# Patient Record
Sex: Female | Born: 1965 | Race: White | Hispanic: No | Marital: Married | Smoking: Never smoker
Health system: Southern US, Community
[De-identification: ages and names within clinical notes are randomized; demographics above are authoritative.]

## PROBLEM LIST (undated history)

## (undated) HISTORY — PX: NASAL SINUS SURGERY: SHX719

## (undated) HISTORY — PX: BREAST ENHANCEMENT SURGERY: SHX7

---

## 2008-09-16 ENCOUNTER — Ambulatory Visit: Payer: Self-pay | Admitting: Internal Medicine

## 2008-09-29 HISTORY — PX: AUGMENTATION MAMMAPLASTY: SUR837

## 2009-12-15 ENCOUNTER — Ambulatory Visit: Payer: Self-pay | Admitting: Internal Medicine

## 2011-10-11 ENCOUNTER — Ambulatory Visit: Payer: Self-pay

## 2011-10-11 LAB — URINALYSIS, COMPLETE
Bilirubin,UR: NEGATIVE
Glucose,UR: NEGATIVE mg/dL (ref 0–75)
Ketone: NEGATIVE
Nitrite: NEGATIVE
Ph: 6 (ref 4.5–8.0)

## 2013-06-28 ENCOUNTER — Ambulatory Visit: Payer: Self-pay | Admitting: Family Medicine

## 2013-10-30 ENCOUNTER — Ambulatory Visit: Payer: Self-pay | Admitting: Physician Assistant

## 2013-10-30 LAB — RAPID STREP-A WITH REFLX: MICRO TEXT REPORT: NEGATIVE

## 2013-11-01 LAB — BETA STREP CULTURE(ARMC)

## 2013-11-22 ENCOUNTER — Ambulatory Visit: Payer: Self-pay | Admitting: Internal Medicine

## 2014-03-05 ENCOUNTER — Ambulatory Visit: Payer: Self-pay | Admitting: Emergency Medicine

## 2014-10-11 ENCOUNTER — Ambulatory Visit: Payer: Self-pay | Admitting: Family Medicine

## 2014-10-31 ENCOUNTER — Ambulatory Visit: Payer: Self-pay | Admitting: Family Medicine

## 2015-10-16 DIAGNOSIS — D500 Iron deficiency anemia secondary to blood loss (chronic): Secondary | ICD-10-CM | POA: Insufficient documentation

## 2016-07-08 ENCOUNTER — Emergency Department
Admission: EM | Admit: 2016-07-08 | Discharge: 2016-07-09 | Disposition: A | Discharge status: Home / Self Care | Payer: Managed Care, Other (non HMO) | Attending: Emergency Medicine | Admitting: Emergency Medicine

## 2016-07-08 ENCOUNTER — Ambulatory Visit
Admission: EM | Admit: 2016-07-08 | Discharge: 2016-07-08 | Disposition: A | Discharge status: Hospice | Payer: Managed Care, Other (non HMO) | Attending: Family Medicine | Admitting: Family Medicine

## 2016-07-08 ENCOUNTER — Encounter: Payer: Self-pay | Admitting: Emergency Medicine

## 2016-07-08 DIAGNOSIS — R5381 Other malaise: Secondary | ICD-10-CM

## 2016-07-08 DIAGNOSIS — M791 Myalgia, unspecified site: Secondary | ICD-10-CM

## 2016-07-08 DIAGNOSIS — B349 Viral infection, unspecified: Secondary | ICD-10-CM | POA: Diagnosis not present

## 2016-07-08 DIAGNOSIS — R509 Fever, unspecified: Secondary | ICD-10-CM

## 2016-07-08 LAB — COMPREHENSIVE METABOLIC PANEL
ALK PHOS: 42 U/L (ref 38–126)
ALT: 15 U/L (ref 14–54)
ANION GAP: 7 (ref 5–15)
AST: 23 U/L (ref 15–41)
Albumin: 4.5 g/dL (ref 3.5–5.0)
BILIRUBIN TOTAL: 0.6 mg/dL (ref 0.3–1.2)
BUN: 13 mg/dL (ref 6–20)
CALCIUM: 9.5 mg/dL (ref 8.9–10.3)
CO2: 28 mmol/L (ref 22–32)
Chloride: 101 mmol/L (ref 101–111)
Creatinine, Ser: 0.73 mg/dL (ref 0.44–1.00)
GLUCOSE: 129 mg/dL — AB (ref 65–99)
POTASSIUM: 3.4 mmol/L — AB (ref 3.5–5.1)
Sodium: 136 mmol/L (ref 135–145)
TOTAL PROTEIN: 7 g/dL (ref 6.5–8.1)

## 2016-07-08 LAB — CBC WITH DIFFERENTIAL/PLATELET
BASOS ABS: 0 10*3/uL (ref 0–0.1)
BASOS PCT: 0 %
Eosinophils Absolute: 0 10*3/uL (ref 0–0.7)
Eosinophils Relative: 0 %
HEMATOCRIT: 36.8 % (ref 35.0–47.0)
Hemoglobin: 12.6 g/dL (ref 12.0–16.0)
LYMPHS PCT: 4 %
Lymphs Abs: 0.4 10*3/uL — ABNORMAL LOW (ref 1.0–3.6)
MCH: 30 pg (ref 26.0–34.0)
MCHC: 34.2 g/dL (ref 32.0–36.0)
MCV: 87.6 fL (ref 80.0–100.0)
MONO ABS: 0.2 10*3/uL (ref 0.2–0.9)
Monocytes Relative: 2 %
NEUTROS ABS: 10 10*3/uL — AB (ref 1.4–6.5)
NEUTROS PCT: 94 %
Platelets: 259 10*3/uL (ref 150–440)
RBC: 4.21 MIL/uL (ref 3.80–5.20)
RDW: 16.5 % — AB (ref 11.5–14.5)
WBC: 10.6 10*3/uL (ref 3.6–11.0)

## 2016-07-08 LAB — URINALYSIS COMPLETE WITH MICROSCOPIC (ARMC ONLY)
BACTERIA UA: NONE SEEN
Bilirubin Urine: NEGATIVE
GLUCOSE, UA: NEGATIVE mg/dL
Leukocytes, UA: NEGATIVE
NITRITE: NEGATIVE
PH: 7 (ref 5.0–8.0)
PROTEIN: NEGATIVE mg/dL
SPECIFIC GRAVITY, URINE: 1.02 (ref 1.005–1.030)
WBC UA: NONE SEEN WBC/hpf (ref 0–5)

## 2016-07-08 LAB — RAPID STREP SCREEN (MED CTR MEBANE ONLY): Streptococcus, Group A Screen (Direct): NEGATIVE

## 2016-07-08 LAB — RAPID INFLUENZA A&B ANTIGENS (ARMC ONLY)
INFLUENZA A (ARMC): NEGATIVE
INFLUENZA B (ARMC): NEGATIVE

## 2016-07-08 NOTE — ED Provider Notes (Signed)
[facility]    CSN: [id number] Arrival date & time: 07/08/16  2000     History   Chief Complaint No chief complaint on file.   HPI Melissa Farrell is a 50 y.o. female.   Patient's here because of fever. Her husband states that they just got through her 3 mile walk about 6:30 and 7 he found her chills and high fever. She denies seeing cold symptoms URI symptoms but she's has some right back pain and a high temp greater than 102. She denies any medical problems. Previous surgery of breast enhancement surgery nasal sinus surgery and termination of pregnancy when she was about 17. She states the last time she felt like this she had positive Streptococcus infection when she was pregnant. She feels miserable. She never smoked. She is allergic to Levaquin and she states that she thinks she is allergic to another antibiotic that she does not know the name of. No pertinent family medical history relative to today's visit.   The history is provided by the patient.  Fever  Temp source:  Oral Onset quality:  Sudden Duration:  3 hours Timing:  Constant Progression:  Unchanged Chronicity:  New Relieved by:  Nothing Worsened by:  Nothing Ineffective treatments:  None tried Associated symptoms: chills and myalgias   Associated symptoms: no chest pain, no confusion, no congestion, no cough, no diarrhea, no dysuria, no ear pain, no headaches, no nausea, no rash, no rhinorrhea, no somnolence, no sore throat and no vomiting     History reviewed. No pertinent past medical history.  There are no active problems to display for this patient.   Past Surgical History:  Procedure Laterality Date  . BREAST ENHANCEMENT SURGERY    . NASAL SINUS SURGERY      OB History    No data available       Home Medications    Prior to Admission medications   Medication Sig Start Date End Date Taking? Authorizing Provider  cycloSPORINE (RESTASIS) 0.05 % ophthalmic emulsion 1 drop 2  (two) times daily.   Yes Historical Provider, MD    Family History History reviewed. No pertinent family history.  Social History Social History  Substance Use Topics  . Smoking status: Never Smoker  . Smokeless tobacco: Never Used  . Alcohol use Yes     Comment: occasionally     Allergies   Levaquin [levofloxacin in d5w]   Review of Systems Review of Systems  Constitutional: Positive for chills and fever.  HENT: Negative for congestion, ear pain, rhinorrhea and sore throat.   Respiratory: Negative for cough.   Cardiovascular: Negative for chest pain.  Gastrointestinal: Negative for diarrhea, nausea and vomiting.  Genitourinary: Positive for vaginal bleeding. Negative for dysuria.  Musculoskeletal: Positive for back pain and myalgias.  Skin: Negative for rash.  Neurological: Negative for headaches.  Psychiatric/Behavioral: Negative for confusion.  All other systems reviewed and are negative.    Physical Exam Triage Vital Signs ED Triage Vitals  Enc Vitals Group     BP 07/08/16 2015 125/65     Pulse Rate 07/08/16 2015 100     Resp 07/08/16 2015 17     Temp 07/08/16 2015 (!) 102.6 F (39.2 C)     Temp Source 07/08/16 2015 Oral     SpO2 07/08/16 2015 98 %     Weight 07/08/16 2016 104 lb (47.2 kg)     Height 07/08/16 2016 5\' 2"  (1.575 m)     Head Circumference --  Peak Flow --      Pain Score 07/08/16 2019 8     Pain Loc --      Pain Edu? --      Excl. in GC? --    No data found.   Updated Vital Signs BP 125/65 (BP Location: Left Arm)   Pulse 100   Temp (!) 102.6 F (39.2 C) (Oral)   Resp 17   Ht 5\' 2"  (1.575 m)   Wt 104 lb (47.2 kg)   SpO2 98%   BMI 19.02 kg/m   Visual Acuity Right Eye Distance:   Left Eye Distance:   Bilateral Distance:    Right Eye Near:   Left Eye Near:    Bilateral Near:     Physical Exam  Constitutional: She is oriented to person, place, and time. She appears well-developed and well-nourished.  HENT:  Head:  Normocephalic and atraumatic.  Right Ear: External ear normal.  Eyes: Pupils are equal, round, and reactive to light.  Neck: Normal range of motion. Neck supple. No tracheal deviation present. No thyromegaly present.  Cardiovascular: Normal rate, regular rhythm and normal heart sounds.   Pulmonary/Chest: Effort normal and breath sounds normal.  Abdominal: Soft.  Musculoskeletal: Normal range of motion.  Neurological: She is alert and oriented to person, place, and time.  Skin: Skin is warm.  Psychiatric: She has a normal mood and affect.  Vitals reviewed.    UC Treatments / Results  Labs (all labs ordered are listed, but only abnormal results are displayed) Labs Reviewed  URINALYSIS COMPLETEWITH MICROSCOPIC ([facility] ONLY) - Abnormal; Notable for the following:       Result Value   Ketones, ur TRACE (*)    Hgb urine dipstick TRACE (*)    Squamous Epithelial / LPF 0-5 (*)    All other components within normal limits  RAPID INFLUENZA A&B ANTIGENS ([facility] ONLY)  RAPID STREP SCREEN (NOT AT [facility])  CULTURE, GROUP A STREP [facility])    EKG  EKG Interpretation None       Radiology No results found.  Procedures Procedures (including critical care time)  Medications Ordered in UC Medications - No data to display   Initial Impression / Assessment and Plan / UC Course  I have reviewed the triage vital signs and the nursing notes.  Pertinent labs & imaging results that were available during my care of the patient were reviewed by me and considered in my medical decision making (see chart for details). Results for orders placed or performed during the hospital encounter of 07/08/16  Rapid Influenza A&B Antigens ([facility] only)  Result Value Ref Range   Influenza A ([facility]) NEGATIVE NEGATIVE   Influenza B ([facility]) NEGATIVE NEGATIVE  Rapid strep screen  Result Value Ref Range   Streptococcus, Group A Screen (Direct) NEGATIVE NEGATIVE  Urinalysis complete, with microscopic  Result Value Ref  Range   Color, Urine YELLOW YELLOW   APPearance CLEAR CLEAR   Glucose, UA NEGATIVE NEGATIVE mg/dL   Bilirubin Urine NEGATIVE NEGATIVE   Ketones, ur TRACE (A) NEGATIVE mg/dL   Specific Gravity, Urine 1.020 1.005 - 1.030   Hgb urine dipstick TRACE (A) NEGATIVE   pH 7.0 5.0 - 8.0   Protein, ur NEGATIVE NEGATIVE mg/dL   Nitrite NEGATIVE NEGATIVE   Leukocytes, UA NEGATIVE NEGATIVE   RBC / HPF 0-5 0 - 5 RBC/hpf   WBC, UA NONE SEEN 0 - 5 WBC/hpf   Bacteria, UA NONE SEEN NONE SEEN   Squamous Epithelial /  LPF 0-5 (A) NONE SEEN   Clinical Course   Because of the high fever and patient having a toxic appearance and the lateness of the evening i.e. they literally with the door for Ms. Ford closed and a pulmonary test all negative will send her to [facility] ED for further evaluation and possible treatment. Discussed charge nurse Racquel that she is aware of the eminent arrival. Final Clinical Impressions(s) / UC Diagnoses   Final diagnoses:  Fever in adult  Malaise    New Prescriptions New Prescriptions   No medications on file    Note: This dictation was prepared with Dragon dictation along with smaller phrase technology. Any transcriptional errors that result from this process are unintentional.   Hassan Rowan, MD 07/08/16 2124

## 2016-07-08 NOTE — Discharge Instructions (Signed)
Discussed with [facility] ED charge nurse Raquel will send her there to be evaluated

## 2016-07-08 NOTE — ED Triage Notes (Signed)
Patient complains of sudden body aches and fever that started 1 hour ago. Patient states that she is also having a sharp pain that comes and goes in her kidney. Patient reports that she had no symptoms previous to onset.

## 2016-07-08 NOTE — ED Triage Notes (Signed)
Pt ambulatory to triage with slow gait, no distress noted. Pt reports [facility] with fever 102.49F and malaise x1 day. Pt was tested for Flu, Strep and UA, all negative findings. Pt advised to report to ED for further testing. Pt [facility], Fever 101.40F at triage. Pt denies N/V/D.

## 2016-07-09 ENCOUNTER — Emergency Department: Payer: Managed Care, Other (non HMO)

## 2016-07-09 LAB — MONONUCLEOSIS SCREEN: MONO SCREEN: NEGATIVE

## 2016-07-09 MED ORDER — IBUPROFEN 400 MG PO TABS
400.0000 mg | ORAL_TABLET | Freq: Once | ORAL | Status: AC
  Administered 2016-07-09: 400 mg via ORAL
  Filled 2016-07-09: qty 1

## 2016-07-09 NOTE — ED Provider Notes (Signed)
[facility] Emergency Department Provider Note   ____________________________________________   First MD Initiated Contact with Patient 07/09/16 0009     (approximate)  I have reviewed the triage vital signs and the nursing notes.   HISTORY  Chief Complaint Fever    HPI Melissa Farrell is a 50 y.o. female who comes into the hospital today with IV aches chills and fevers. The patient reports that this started at 7 PM after she had gone on a walk with her spouse. The patient reports that she took some Advil but then felt worse so she decided to go to urgent care. At urgent care the patient was tested for flu, strep and urine which was all negative. The patient was told to come in to the emergency department for more tests. At urgent care her temperature was up to 12. She was given some Tylenol and her temperature still 101.3. The patient has had a mild cough but nothing significant. She reports that she has some mild vaginal bleeding after not having a period for almost a year. Her spouse reports she was bent over in pain from the body aches. The patient denies any sick contacts. She has a mild headache with some scratchy throat. She denies any abdominal pain, nausea, vomiting or urinary symptoms. The patient is here for evaluation. The patient rates her pain a 6 out of 10 in intensity.   History reviewed. No pertinent past medical history.  There are no active problems to display for this patient.   Past Surgical History:  Procedure Laterality Date  . BREAST ENHANCEMENT SURGERY    . NASAL SINUS SURGERY      Prior to Admission medications   Medication Sig Start Date End Date Taking? Authorizing Provider  cycloSPORINE (RESTASIS) 0.05 % ophthalmic emulsion 1 drop 2 (two) times daily.    Historical Provider, MD    Allergies Levaquin [levofloxacin in d5w]  History reviewed. No pertinent family history.  Social History Social History  Substance  Use Topics  . Smoking status: Never Smoker  . Smokeless tobacco: Never Used  . Alcohol use Yes     Comment: occasionally    Review of Systems Constitutional:  fever/chills Eyes: No visual changes. ENT: Scratchy throat Cardiovascular: Denies chest pain. Respiratory: Mild cough Gastrointestinal: No abdominal pain.  No nausea, no vomiting.  No diarrhea.  No constipation. Genitourinary: Negative for dysuria. Musculoskeletal: Negative for back pain. Skin: Negative for rash. Neurological: Headache  10-point ROS otherwise negative.  ____________________________________________   PHYSICAL EXAM:  VITAL SIGNS: ED Triage Vitals  Enc Vitals Group     BP --      Pulse Rate 07/08/16 2143 88     Resp 07/08/16 2143 15     Temp 07/08/16 2143 (!) 101.3 F (38.5 C)     Temp Source 07/08/16 2143 Oral     SpO2 07/08/16 2143 98 %     Weight 07/08/16 2144 104 lb (47.2 kg)     Height 07/08/16 2144 5\' 2"  (1.575 m)     Head Circumference --      Peak Flow --      Pain Score 07/08/16 2144 6     Pain Loc --      Pain Edu? --      Excl. in GC? --     Constitutional: Alert and oriented. Ill appearing and in Mild distress. Eyes: Conjunctivae are normal. PERRL. EOMI. Head: Atraumatic. Nose: No congestion/rhinnorhea. Mouth/Throat: Mucous membranes are moist.  Oropharynx non-erythematous. Neck: No stridor.  Hematological/Lymphatic/Immunilogical: No cervical lymphadenopathy. Cardiovascular: Normal rate, regular rhythm. Grossly normal heart sounds.  Good peripheral circulation. Respiratory: Normal respiratory effort.  No retractions. Lungs CTAB. Gastrointestinal: Soft and nontender. No distention. Positive bowel sounds Musculoskeletal: No lower extremity tenderness nor edema.   Neurologic:  Normal speech and language.  Skin:  Skin is warm, dry and intact.  Psychiatric: Mood and affect are normal.   ____________________________________________   LABS (all labs ordered are listed, but only  abnormal results are displayed)  Labs Reviewed  COMPREHENSIVE METABOLIC PANEL - Abnormal; Notable for the following:       Result Value   Potassium 3.4 (*)    Glucose, Bld 129 (*)    All other components within normal limits  CBC WITH DIFFERENTIAL/PLATELET - Abnormal; Notable for the following:    RDW 16.5 (*)    Neutro Abs 10.0 (*)    Lymphs Abs 0.4 (*)    All other components within normal limits  MONONUCLEOSIS SCREEN   ____________________________________________  EKG  None ____________________________________________  RADIOLOGY  Chest x-ray ____________________________________________   PROCEDURES  Procedure(s) performed: None  Procedures  Critical Care performed: No  ____________________________________________   INITIAL IMPRESSION / ASSESSMENT AND PLAN / ED COURSE  Pertinent labs & imaging results that were available during my care of the patient were reviewed by me and considered in my medical decision making (see chart for details).  This is a 50 year old female who comes into the hospital today with a fever. The patient also is having some body aches. The patient did have a temperature to 101.3 here after some ibuprofen as well as Tylenol. The patient had some blood work which was unremarkable. Her urinalysis is negative and her flu and strep are negative. I will add a mono onto the patient's blood work and I will give the patient another 400 mg of ibuprofen. We will attempt some oral hydration and the patient will be reassessed.  Clinical Course  Value Comment By Time  DG Chest 2 View No acute cardiopulmonary process seen. Rebecka ApleyAllison P Jess Toney, MD 10/11 [phone]    The patient's chest x-ray is unremarkable and her mono was negative. The patient's blood work is also unremarkable. The patient's temperature is also improved. I feel that the patient has a viral illness. I encouraged her to stay hydrated and to rotate Tylenol and ibuprofen. The patient should also  follow-up with her primary care physician for further evaluation. I informed her that she may develop further symptoms over the next few days so she has any questions or concerns she may return to the emergency department for further evaluation. ____________________________________________   FINAL CLINICAL IMPRESSION(S) / ED DIAGNOSES  Final diagnoses:  Viral illness  Febrile illness  Fever, unspecified fever cause  Myalgia      NEW MEDICATIONS STARTED DURING THIS VISIT:  Discharge Medication List as of 07/09/2016  2:12 AM       Note:  This document was prepared using Dragon voice recognition software and may include unintentional dictation errors.    Rebecka ApleyAllison P Asami Lambright, MD 07/09/16 [phone]

## 2016-07-10 ENCOUNTER — Telehealth: Payer: Self-pay | Admitting: *Deleted

## 2016-07-10 LAB — URINE CULTURE
CULTURE: NO GROWTH
SPECIAL REQUESTS: NORMAL

## 2016-07-10 NOTE — Telephone Encounter (Signed)
Called patient, verified DOB, communicated negative urine culture result. Patient reported feeling better. Advised patient to follow up with PCP or [facility] if symptoms return.

## 2016-07-11 LAB — CULTURE, GROUP A STREP (THRC)

## 2017-03-04 ENCOUNTER — Other Ambulatory Visit: Payer: Self-pay | Admitting: Family Medicine

## 2017-03-04 DIAGNOSIS — Z1231 Encounter for screening mammogram for malignant neoplasm of breast: Secondary | ICD-10-CM

## 2017-03-09 ENCOUNTER — Ambulatory Visit
Admission: RE | Admit: 2017-03-09 | Discharge: 2017-03-09 | Disposition: A | Discharge status: Home / Self Care | Payer: Managed Care, Other (non HMO) | Source: Ambulatory Visit | Attending: Family Medicine | Admitting: Family Medicine

## 2017-03-09 ENCOUNTER — Other Ambulatory Visit: Payer: Self-pay | Admitting: Family Medicine

## 2017-03-09 DIAGNOSIS — Z1231 Encounter for screening mammogram for malignant neoplasm of breast: Secondary | ICD-10-CM

## 2020-04-16 ENCOUNTER — Other Ambulatory Visit: Payer: Self-pay | Admitting: Family Medicine

## 2020-04-23 ENCOUNTER — Other Ambulatory Visit: Payer: Self-pay | Admitting: Family Medicine

## 2020-04-23 DIAGNOSIS — Z1231 Encounter for screening mammogram for malignant neoplasm of breast: Secondary | ICD-10-CM

## 2020-04-25 ENCOUNTER — Other Ambulatory Visit: Payer: Self-pay

## 2020-04-25 ENCOUNTER — Ambulatory Visit
Admission: RE | Admit: 2020-04-25 | Discharge: 2020-04-25 | Disposition: A | Discharge status: Home / Self Care | Payer: Managed Care, Other (non HMO) | Source: Ambulatory Visit | Attending: Family Medicine | Admitting: Family Medicine

## 2020-04-25 ENCOUNTER — Encounter (INDEPENDENT_AMBULATORY_CARE_PROVIDER_SITE_OTHER): Payer: Self-pay

## 2020-04-25 DIAGNOSIS — Z1231 Encounter for screening mammogram for malignant neoplasm of breast: Secondary | ICD-10-CM | POA: Insufficient documentation

## 2021-06-17 DIAGNOSIS — R87610 Atypical squamous cells of undetermined significance on cytologic smear of cervix (ASC-US): Secondary | ICD-10-CM | POA: Insufficient documentation

## 2021-09-26 ENCOUNTER — Other Ambulatory Visit: Payer: Self-pay | Admitting: Family Medicine

## 2021-09-26 DIAGNOSIS — Z1231 Encounter for screening mammogram for malignant neoplasm of breast: Secondary | ICD-10-CM

## 2021-10-01 ENCOUNTER — Inpatient Hospital Stay: Admission: RE | Admit: 2021-10-01 | Payer: Managed Care, Other (non HMO) | Source: Ambulatory Visit

## 2021-10-04 ENCOUNTER — Other Ambulatory Visit: Admission: RE | Admit: 2021-10-04 | Payer: Self-pay | Source: Home / Self Care | Admitting: *Deleted

## 2021-11-04 ENCOUNTER — Other Ambulatory Visit: Payer: Self-pay | Admitting: Family Medicine

## 2021-11-04 ENCOUNTER — Other Ambulatory Visit: Payer: Self-pay

## 2021-11-04 ENCOUNTER — Ambulatory Visit
Admission: RE | Admit: 2021-11-04 | Discharge: 2021-11-04 | Disposition: A | Discharge status: Home / Self Care | Payer: BC Managed Care – PPO | Source: Ambulatory Visit | Attending: Family Medicine | Admitting: Family Medicine

## 2021-11-04 DIAGNOSIS — Z1231 Encounter for screening mammogram for malignant neoplasm of breast: Secondary | ICD-10-CM

## 2022-12-13 IMAGING — MG DIGITAL SCREENING BREAST BILAT IMPLANT W/ TOMO W/ CAD
8 of 12 series · 8 of 28 positions shown · non-contrast
Comparison: Previous exam(s).

CLINICAL DATA: Screening.

EXAM:
DIGITAL SCREENING BILATERAL MAMMOGRAM WITH IMPLANTS, CAD AND
TOMOSYNTHESIS
TECHNIQUE: Bilateral screening digital craniocaudal and mediolateral oblique
mammograms were obtained. Bilateral screening digital breast
tomosynthesis was performed. The images were evaluated with
computer-aided detection. Standard and/or implant displaced views
were performed.

[R CC]
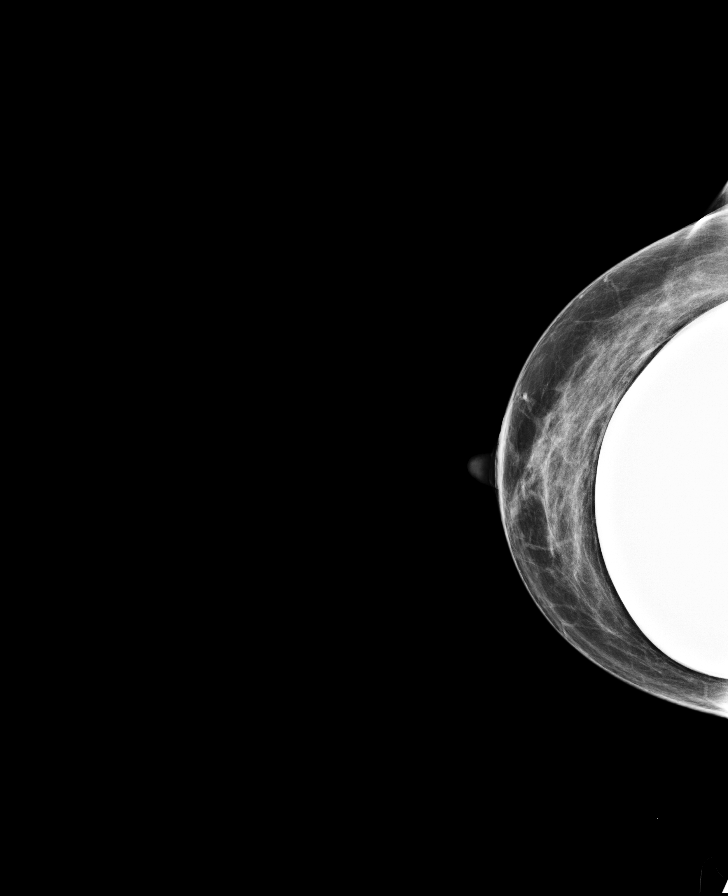

[L CC]
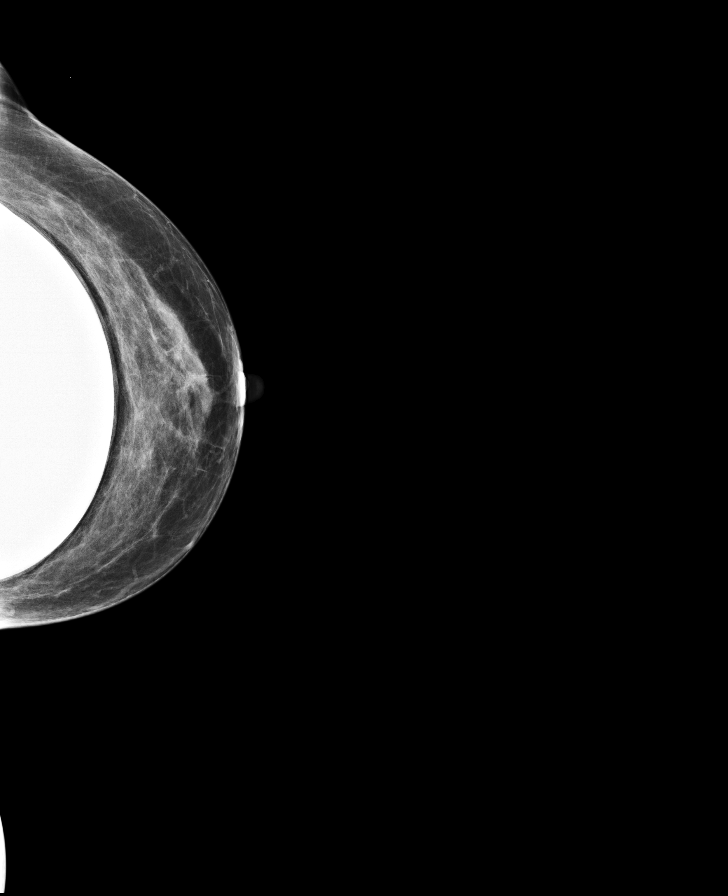

[R MLO]
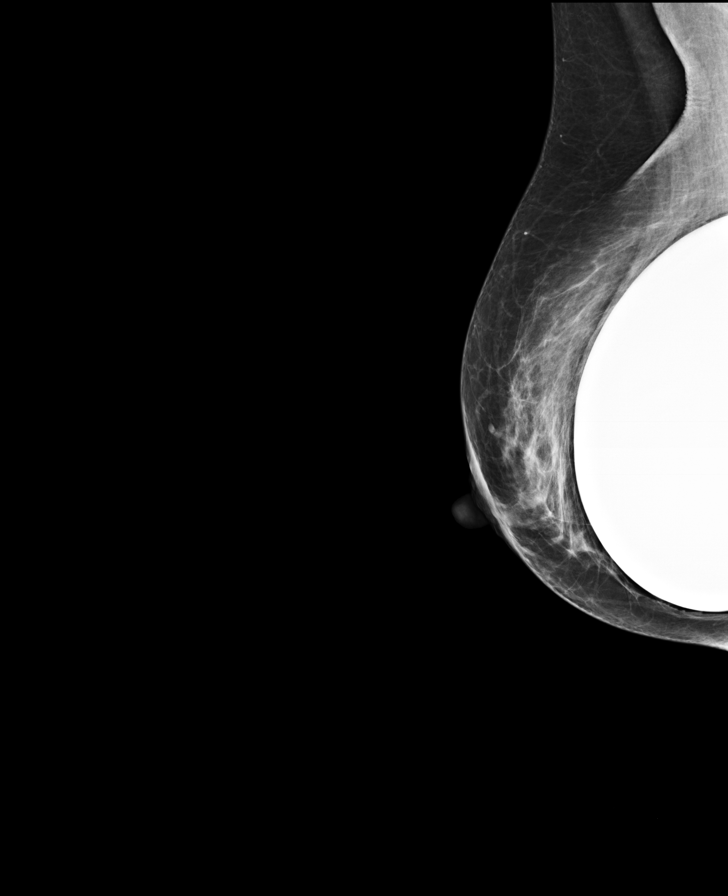

[L MLO]
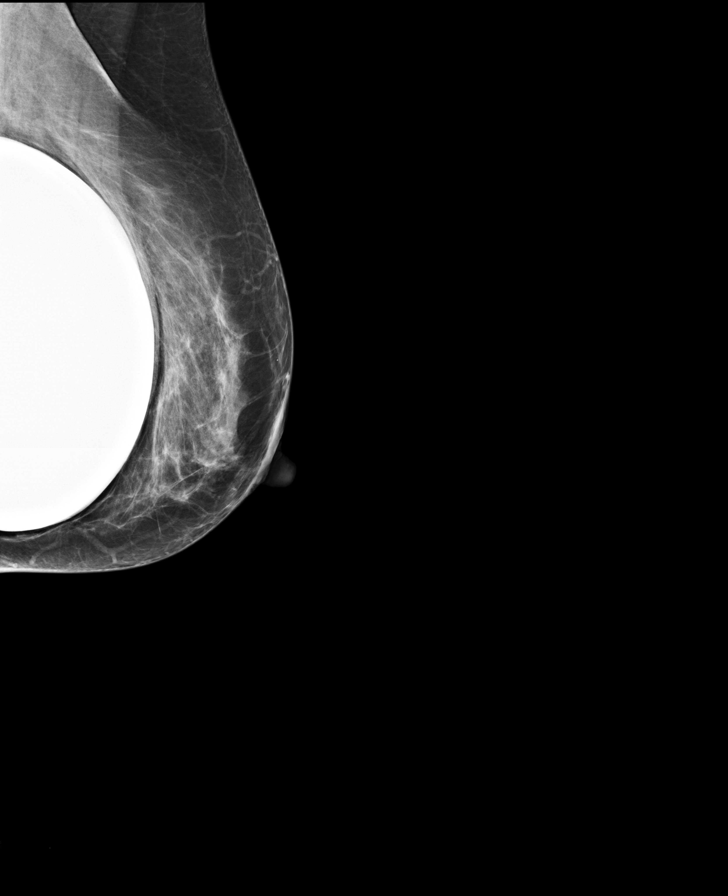

[L CC synth-2D]
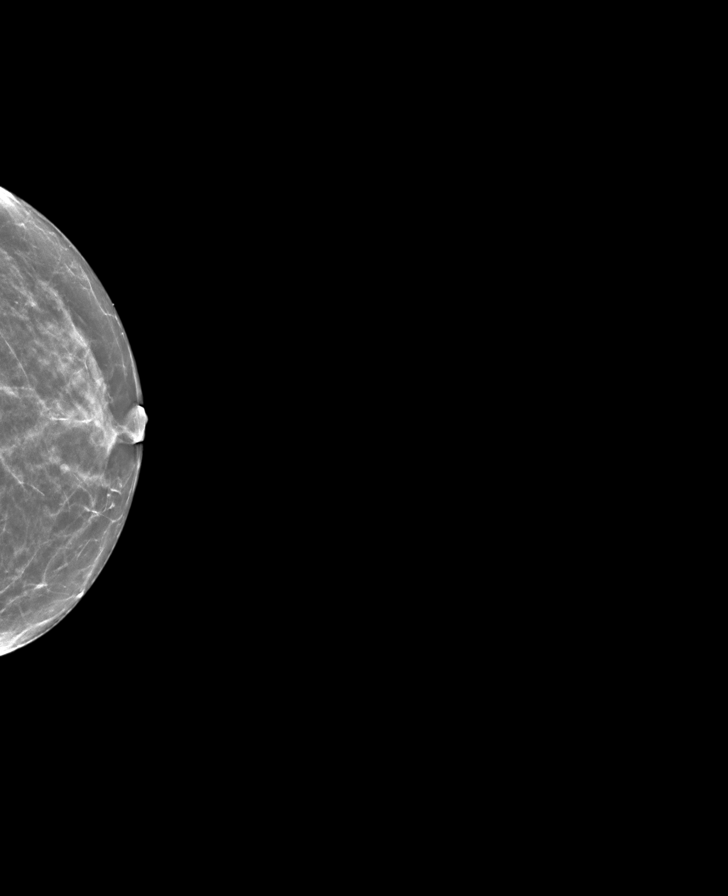

[R MLO synth-2D]
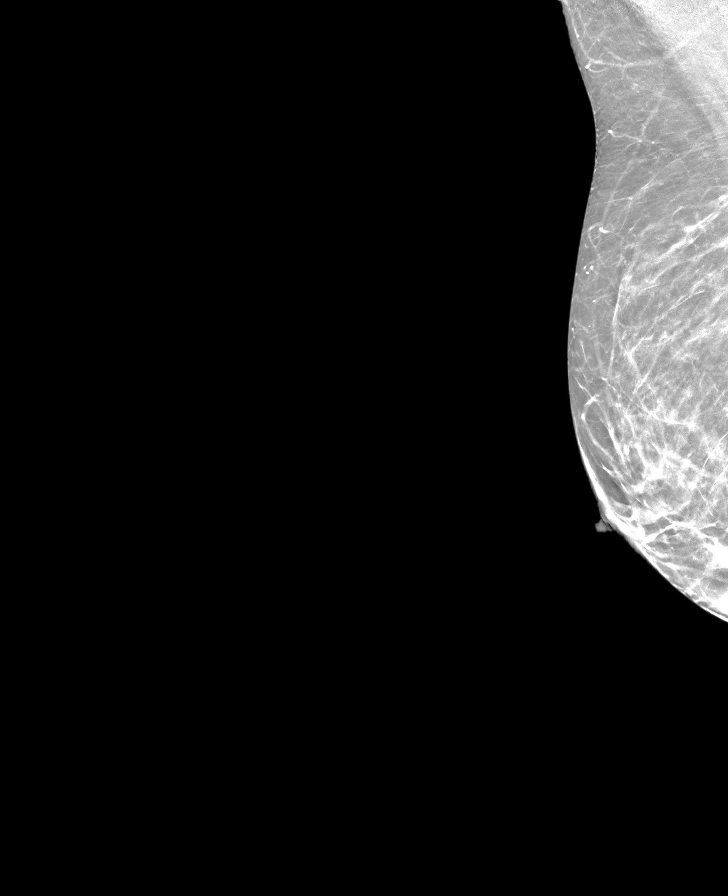

[L MLO synth-2D]
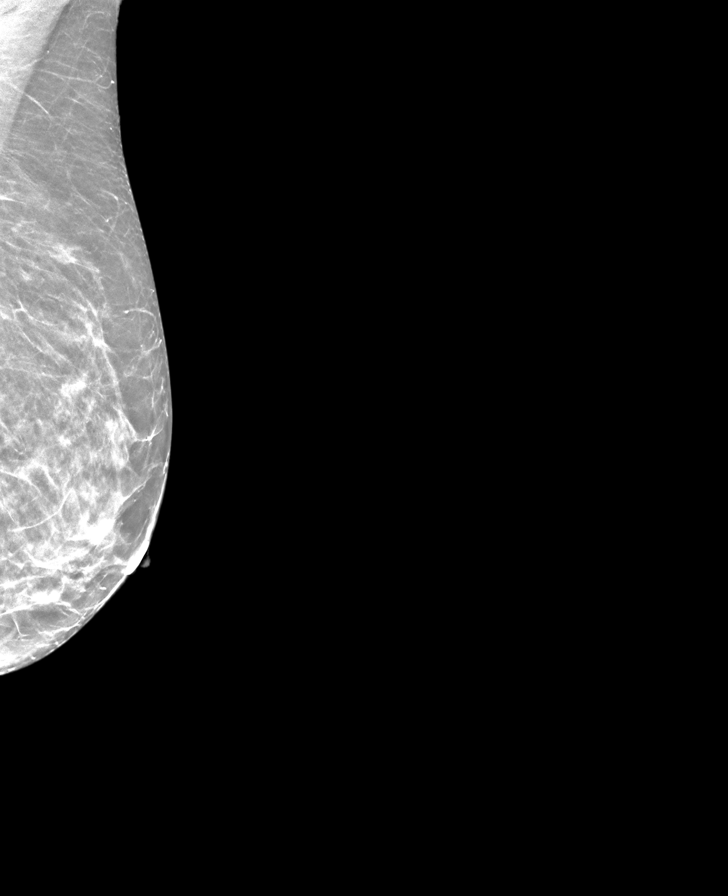

[R CC synth-2D]
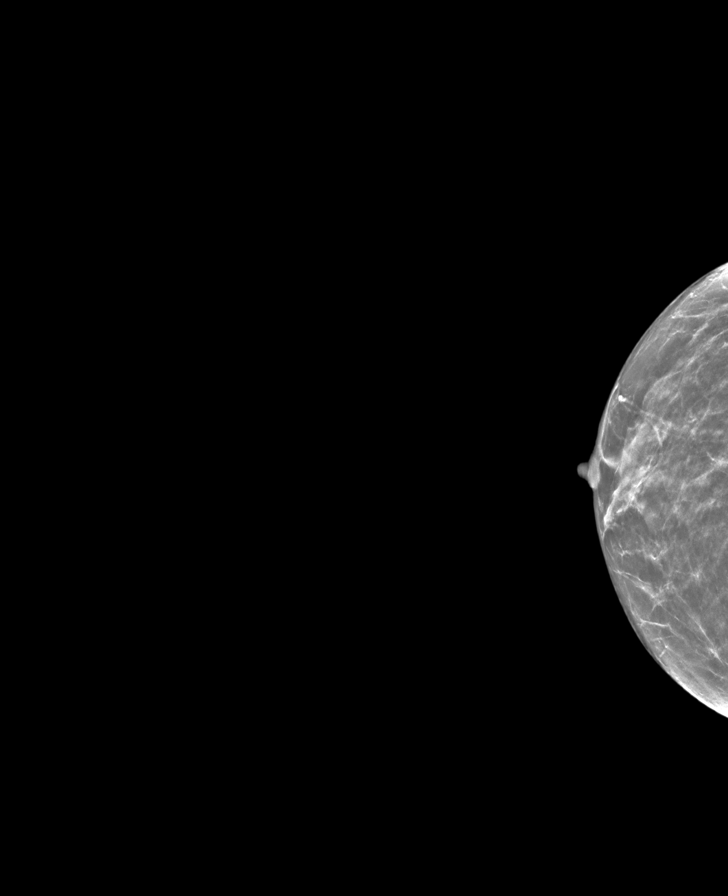

[8 of 28 positions shown; findings below may reference images not displayed]

ACR Breast Density Category c: The breast tissue is heterogeneously
dense, which may obscure small masses.
FINDINGS: The patient has retropectoral implants. There are no findings
suspicious for malignancy.
IMPRESSION: No mammographic evidence of malignancy. A result letter of this
screening mammogram will be mailed directly to the patient.

RECOMMENDATION:
Screening mammogram in one year. (Code:LT-E-7TH)

BI-RADS CATEGORY  1:  Negative.

## 2023-02-26 ENCOUNTER — Other Ambulatory Visit: Payer: Self-pay

## 2023-02-26 DIAGNOSIS — Z1231 Encounter for screening mammogram for malignant neoplasm of breast: Secondary | ICD-10-CM

## 2023-03-18 ENCOUNTER — Other Ambulatory Visit: Payer: Self-pay | Admitting: Family Medicine

## 2023-03-18 DIAGNOSIS — Z1231 Encounter for screening mammogram for malignant neoplasm of breast: Secondary | ICD-10-CM

## 2023-03-19 ENCOUNTER — Inpatient Hospital Stay: Admission: RE | Admit: 2023-03-19 | Payer: BC Managed Care – PPO | Source: Ambulatory Visit

## 2023-04-21 ENCOUNTER — Ambulatory Visit
Admission: RE | Admit: 2023-04-21 | Discharge: 2023-04-21 | Disposition: A | Discharge status: Home / Self Care | Payer: BC Managed Care – PPO | Source: Ambulatory Visit | Attending: Family Medicine | Admitting: Family Medicine

## 2023-04-21 DIAGNOSIS — Z1231 Encounter for screening mammogram for malignant neoplasm of breast: Secondary | ICD-10-CM | POA: Diagnosis not present

## 2023-05-26 DIAGNOSIS — N951 Menopausal and female climacteric states: Secondary | ICD-10-CM | POA: Insufficient documentation

## 2024-03-13 ENCOUNTER — Ambulatory Visit
Admission: EM | Admit: 2024-03-13 | Discharge: 2024-03-13 | Disposition: A | Discharge status: Home / Self Care | Attending: Emergency Medicine | Admitting: Emergency Medicine

## 2024-03-13 DIAGNOSIS — N390 Urinary tract infection, site not specified: Secondary | ICD-10-CM | POA: Diagnosis not present

## 2024-03-13 DIAGNOSIS — B3731 Acute candidiasis of vulva and vagina: Secondary | ICD-10-CM | POA: Insufficient documentation

## 2024-03-13 LAB — URINALYSIS, W/ REFLEX TO CULTURE (INFECTION SUSPECTED)
Bilirubin Urine: NEGATIVE
Glucose, UA: NEGATIVE mg/dL
Ketones, ur: NEGATIVE mg/dL
Nitrite: NEGATIVE
Specific Gravity, Urine: 1.02 (ref 1.005–1.030)
pH: 7 (ref 5.0–8.0)

## 2024-03-13 MED ORDER — PHENAZOPYRIDINE HCL 200 MG PO TABS: 200.0000 mg | ORAL_TABLET | Freq: Three times a day (TID) | ORAL | 0 refills | Status: AC

## 2024-03-13 MED ORDER — FLUCONAZOLE 150 MG PO TABS: 150.0000 mg | ORAL_TABLET | ORAL | 0 refills | Status: AC

## 2024-03-13 MED ORDER — NITROFURANTOIN MONOHYD MACRO 100 MG PO CAPS: 100.0000 mg | ORAL_CAPSULE | Freq: Two times a day (BID) | ORAL | 0 refills | Status: AC

## 2024-03-13 NOTE — Discharge Instructions (Addendum)
 Take the Macrobid twice daily for 5 days with food for treatment of urinary tract infection.  Use the Pyridium every 8 hours as needed for urinary discomfort.  This will turn your urine a bright red-orange.  Increase your oral fluid intake so that you increase your urine production and or flushing your urinary system.  Take an over-the-counter probiotic, such as Culturelle-Align-Activia, 1 hour after each dose of antibiotic to prevent diarrhea or yeast infections from forming.  We will culture urine and change the antibiotics if necessary.  They also found budding yeast when they looked at your urine under the microscope which could indicate that you are developing a vaginal yeast infection.  Take the Diflucan tablets for treatment of your developing yeast infection.  Take 1 tablet now and repeat dosing every 3 days for total of 3 doses.  Return for reevaluation, or see your primary care provider, for any new or worsening symptoms.

## 2024-03-13 NOTE — ED Provider Notes (Signed)
 [facility]    CSN: [id number] Arrival date & time: 03/13/24  1208      History   Chief Complaint Chief Complaint  Patient presents with   Dysuria    HPI Melissa Farrell is a 58 y.o. female.   HPI  58 year old female with past medical history significant for IDA presents for evaluation of burning with urinary frequency and urgency that started yesterday.  She denies any hematuria.  2 days prior to onset of urinary symptoms she had fever, chills, and bodyaches that lasted for 24 hours and then resolved.  No other associated symptoms.  History reviewed. No pertinent past medical history.  Patient Active Problem List   Diagnosis Date Noted   Menopausal vaginal dryness 05/26/2023   Atypical squamous cells of undetermined significance on cytologic smear of cervix (ASC-US ) 06/17/2021   Iron deficiency anemia due to chronic blood loss 10/16/2015    Past Surgical History:  Procedure Laterality Date   AUGMENTATION MAMMAPLASTY Bilateral 2010   BREAST ENHANCEMENT SURGERY     NASAL SINUS SURGERY      OB History   No obstetric history on file.      Home Medications    Prior to Admission medications   Medication Sig Start Date End Date Taking? Authorizing Provider  estradiol (ESTRACE) 0.1 MG/GM vaginal cream Insert fingertip unit amount vaginally at night (0.5g) x 2 weeks then twice weekly for maintenance 07/21/23 07/20/24 Yes [provider]  fluconazole (DIFLUCAN) 150 MG tablet Take 1 tablet (150 mg total) by mouth every 3 (three) days for 3 doses. 03/13/24 03/20/24 Yes Kent Pear, NP  nitrofurantoin, macrocrystal-monohydrate, (MACROBID) 100 MG capsule Take 1 capsule (100 mg total) by mouth 2 (two) times daily. 03/13/24  Yes Kent Pear, NP  nystatin (MYCOSTATIN/NYSTOP) powder Apply topically 2 (two) times daily 02/03/24 02/02/25 Yes [provider]  phenazopyridine (PYRIDIUM) 200 MG tablet Take 1 tablet (200 mg total) by mouth 3 (three) times  daily. 03/13/24  Yes Kent Pear, NP  amLODipine (NORVASC) 2.5 MG tablet Take 1 tablet (2.5 mg total) by mouth once daily 06/08/23 06/07/24  [provider]  sertraline (ZOLOFT) 50 MG tablet Take 50 mg by mouth daily. 07/08/23 07/07/24  [provider]    Family History Family History  Problem Relation Age of Onset   Breast cancer Cousin 46       pat cousin   Breast cancer Paternal Aunt 62    Social History Social History   Tobacco Use   Smoking status: Never   Smokeless tobacco: Never  Substance Use Topics   Alcohol use: Yes    Comment: occasionally   Drug use: No     Allergies   Levaquin [levofloxacin in d5w]   Review of Systems Review of Systems  Gastrointestinal:  Positive for abdominal pain.       Suprapubic pain  Genitourinary:  Positive for dysuria, frequency and urgency. Negative for hematuria.  Musculoskeletal:  Negative for back pain.     Physical Exam Triage Vital Signs ED Triage Vitals [03/13/24 1218]  Encounter Vitals Group     BP      Girls Systolic BP Percentile      Girls Diastolic BP Percentile      Boys Systolic BP Percentile      Boys Diastolic BP Percentile      Pulse      Resp 16     Temp      Temp Source Oral  SpO2      Weight      Height      Head Circumference      Peak Flow      Pain Score      Pain Loc      Pain Education      Exclude from Growth Chart    No data found.  Updated Vital Signs BP (!) 151/105 (BP Location: Left Arm)   Pulse 65   Temp 98.6 F (37 C) (Oral)   Resp 16   Ht 5' 2 (1.575 m)   Wt 135 lb (61.2 kg)   SpO2 99%   BMI 24.69 kg/m   Visual Acuity Right Eye Distance:   Left Eye Distance:   Bilateral Distance:    Right Eye Near:   Left Eye Near:    Bilateral Near:     Physical Exam Vitals and nursing note reviewed.  Constitutional:      Appearance: Normal appearance. She is not ill-appearing.  HENT:     Head: Normocephalic and atraumatic.   Cardiovascular:     Rate and  Rhythm: Normal rate and regular rhythm.     Pulses: Normal pulses.     Heart sounds: Normal heart sounds. No murmur heard.    No friction rub. No gallop.  Pulmonary:     Effort: Pulmonary effort is normal.     Breath sounds: Normal breath sounds. No wheezing, rhonchi or rales.  Abdominal:     General: Abdomen is flat.     Palpations: Abdomen is soft.     Tenderness: There is abdominal tenderness. There is no right CVA tenderness, left CVA tenderness, guarding or rebound.     Comments: Mild suprapubic tenderness.  Remainder the abdomen is benign.  No CVA tenderness noted.   Skin:    General: Skin is warm and dry.     Capillary Refill: Capillary refill takes less than 2 seconds.     Findings: No rash.   Neurological:     General: No focal deficit present.     Mental Status: She is alert and oriented to person, place, and time.      UC Treatments / Results  Labs (all labs ordered are listed, but only abnormal results are displayed) Labs Reviewed  URINALYSIS, W/ REFLEX TO CULTURE (INFECTION SUSPECTED) - Abnormal; Notable for the following components:      Result Value   Hgb urine dipstick TRACE (*)    Protein, ur TRACE (*)    Leukocytes,Ua SMALL (*)    Bacteria, UA FEW (*)    All other components within normal limits  URINE CULTURE    EKG   Radiology No results found.  Procedures Procedures (including critical care time)  Medications Ordered in UC Medications - No data to display  Initial Impression / Assessment and Plan / UC Course  I have reviewed the triage vital signs and the nursing notes.  Pertinent labs & imaging results that were available during my care of the patient were reviewed by me and considered in my medical decision making (see chart for details).   Patient is a pleasant, nontoxic-appearing 58 year old female presenting for evaluation of UTI symptoms as outlined in HPI above.  She did indicate that 2 days prior to onset of urinary symptoms she had  24 hours of fever, chills, and bodyaches that spontaneously resolved.  No associated respiratory symptoms.  This may very well have been prodromal to his urinary tract infection.  On exam she has  some mild suprapubic tenderness but the remainder of her abdomen is benign, soft, and flat.  She has no CVA tenderness on exam.  I will order a urinalysis to assess for the presence of UTI.  Urinalysis shows trace hemoglobin with trace protein and small leukocyte esterase.  Negative for nitrates or glucose.  Reflex microscopy shows 21-50 WBCs, 6-10 RBCs, few bacteria, WBC clumps, and budding yeast present.  Urine will reflex to culture  I will discharge patient with diagnosis of urinary tract infection and vaginal yeast infection.  I will start her on Macrobid and 100 mg twice daily for 5 days treatment of her UTI along with Pyridium that she can use every 8 hours as needed for urinary discomfort.  Additionally I will prescribe Diflucan 150 mg tablets and have her take 1 tablet now and repeat dosing every 3 days for total of 3 doses.   Final Clinical Impressions(s) / UC Diagnoses   Final diagnoses:  Lower urinary tract infectious disease  Vaginal yeast infection     Discharge Instructions      Take the Macrobid twice daily for 5 days with food for treatment of urinary tract infection.  Use the Pyridium every 8 hours as needed for urinary discomfort.  This will turn your urine a bright red-orange.  Increase your oral fluid intake so that you increase your urine production and or flushing your urinary system.  Take an over-the-counter probiotic, such as Culturelle-Align-Activia, 1 hour after each dose of antibiotic to prevent diarrhea or yeast infections from forming.  We will culture urine and change the antibiotics if necessary.  They also found budding yeast when they looked at your urine under the microscope which could indicate that you are developing a vaginal yeast infection.  Take the  Diflucan tablets for treatment of your developing yeast infection.  Take 1 tablet now and repeat dosing every 3 days for total of 3 doses.  Return for reevaluation, or see your primary care provider, for any new or worsening symptoms.      ED Prescriptions     Medication Sig Dispense Auth. Provider   fluconazole (DIFLUCAN) 150 MG tablet Take 1 tablet (150 mg total) by mouth every 3 (three) days for 3 doses. 3 tablet Kent Pear, NP   nitrofurantoin, macrocrystal-monohydrate, (MACROBID) 100 MG capsule Take 1 capsule (100 mg total) by mouth 2 (two) times daily. 10 capsule Kent Pear, NP   phenazopyridine (PYRIDIUM) 200 MG tablet Take 1 tablet (200 mg total) by mouth 3 (three) times daily. 6 tablet Kent Pear, NP      PDMP not reviewed this encounter.   Kent Pear, NP 03/13/24 1304

## 2024-03-13 NOTE — ED Triage Notes (Signed)
 Pt c/o urinary freq & burning x1 day. Denies any hematuria.

## 2024-03-14 ENCOUNTER — Other Ambulatory Visit: Payer: Self-pay

## 2024-03-15 ENCOUNTER — Ambulatory Visit (HOSPITAL_COMMUNITY): Payer: Self-pay

## 2024-03-15 LAB — URINE CULTURE: Culture: >=100000 — AB

## 2024-06-13 ENCOUNTER — Other Ambulatory Visit: Payer: Self-pay | Admitting: Family Medicine

## 2024-06-13 DIAGNOSIS — E782 Mixed hyperlipidemia: Secondary | ICD-10-CM

## 2024-06-14 ENCOUNTER — Other Ambulatory Visit: Payer: Self-pay | Admitting: Family Medicine

## 2024-06-14 DIAGNOSIS — Z1231 Encounter for screening mammogram for malignant neoplasm of breast: Secondary | ICD-10-CM

## 2024-06-24 ENCOUNTER — Ambulatory Visit
Admission: RE | Admit: 2024-06-24 | Discharge: 2024-06-24 | Disposition: A | Discharge status: Home / Self Care | Payer: Self-pay | Source: Ambulatory Visit | Attending: Family Medicine | Admitting: Family Medicine

## 2024-06-24 DIAGNOSIS — E782 Mixed hyperlipidemia: Secondary | ICD-10-CM | POA: Insufficient documentation

## 2024-07-28 ENCOUNTER — Ambulatory Visit
Admission: RE | Admit: 2024-07-28 | Discharge: 2024-07-28 | Disposition: A | Discharge status: Home / Self Care | Source: Ambulatory Visit | Attending: Family Medicine | Admitting: Family Medicine

## 2024-07-28 DIAGNOSIS — Z1231 Encounter for screening mammogram for malignant neoplasm of breast: Secondary | ICD-10-CM | POA: Insufficient documentation

## 2024-10-03 ENCOUNTER — Telehealth: Payer: Self-pay
